# Patient Record
Sex: Male | Born: 2000 | Race: Black or African American | Hispanic: No | Marital: Single | Smoking: Never smoker
Health system: Southern US, Community
[De-identification: ages and names within clinical notes are randomized; demographics above are authoritative.]

## PROBLEM LIST (undated history)

## (undated) DIAGNOSIS — T7840XA Allergy, unspecified, initial encounter: Secondary | ICD-10-CM

## (undated) HISTORY — DX: Allergy, unspecified, initial encounter: T78.40XA

---
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
## 2021-01-01 DIAGNOSIS — Z1152 Encounter for screening for COVID-19: Secondary | ICD-10-CM | POA: Diagnosis not present

## 2021-04-24 ENCOUNTER — Encounter: Payer: Self-pay | Admitting: Family Medicine

## 2021-04-24 ENCOUNTER — Other Ambulatory Visit: Payer: Self-pay

## 2021-04-24 ENCOUNTER — Ambulatory Visit (INDEPENDENT_AMBULATORY_CARE_PROVIDER_SITE_OTHER): Payer: 59 | Admitting: Family Medicine

## 2021-04-24 VITALS — BP 118/64 | HR 56 | Temp 97.3°F | Resp 18 | Ht 70.5 in | Wt 159.0 lb

## 2021-04-24 DIAGNOSIS — Z23 Encounter for immunization: Secondary | ICD-10-CM

## 2021-04-24 DIAGNOSIS — G1220 Motor neuron disease, unspecified: Secondary | ICD-10-CM

## 2021-04-24 DIAGNOSIS — R531 Weakness: Secondary | ICD-10-CM | POA: Diagnosis not present

## 2021-04-24 DIAGNOSIS — Z0000 Encounter for general adult medical examination without abnormal findings: Secondary | ICD-10-CM

## 2021-04-24 NOTE — Progress Notes (Signed)
Today I helps her headache Ajovy organ review although would you ask Angie does see if she could waive a Mr. Setser's chart so I can close it thank you for  Established Patient Office Visit  Subjective:  Patient ID: Zachary Mcneil, male    DOB: 2000/08/19  Age: 20 y.o. MRN: [id number]  CC:  Chief Complaint  Patient presents with   Establish Care    Pt c/o Weakness right  side, both arm and leg tingling, pt can't run, pt denies injury to right side. Pts mother states he has lost strength in both are and leg, major loss of mobility visibly slower    HPI [name] presents for evaluation of a 2 to 74-month history of right-sided weakness/slowness as compared to his left side.  He is right-hand dominant.  He has noticed it when he runs or plays the drums in his church.  Is hard for him to keep time.  He is accompanied by his mother who confirms.  Denies any trauma to his head denies headaches, neck pain, mid or low back pain.  Denies changes in his vision hearing taste swallowing.  Denies paresthesias or dysesthesias on either side of his body.  Denies diplopia.  He has a stable at [employer] within undeclared major.  Things are going well.  Past Medical History:  Diagnosis Date   Allergy     History reviewed. No pertinent surgical history.  Family History  Problem Relation Age of Onset   40 / Stillbirths Mother    Heart disease Mother    Anxiety disorder Mother    Asthma Brother    Hypertension Maternal Grandmother    Heart disease Maternal Grandmother    Hypertension Maternal Grandfather    Kidney disease Paternal Grandmother    Hypertension Paternal Grandmother    Diabetes Paternal Grandmother    Hypertension Paternal Grandfather     Social History   Socioeconomic History   Marital status: Single    Spouse name: Not on file   Number of children: Not on file   Years of education: Not on file   Highest education level: Not on file  Occupational History   Not on  file  Tobacco Use   Smoking status: Never   Smokeless tobacco: Never  Substance and Sexual Activity   Alcohol use: Never   Drug use: Never   Sexual activity: Never    Birth control/protection: Abstinence  Other Topics Concern   Not on file  Social History Narrative   Not on file   Social Determinants of Health   Financial Resource Strain: Not on file  Food Insecurity: Not on file  Transportation Needs: Not on file  Physical Activity: Not on file  Stress: Not on file  Social Connections: Not on file  Intimate Partner Violence: Not on file    No outpatient medications prior to visit.   No facility-administered medications prior to visit.    No Known Allergies  ROS Review of Systems  Constitutional:  Negative for chills, diaphoresis, fatigue, fever and unexpected weight change.  HENT: Negative.    Eyes:  Negative for photophobia and visual disturbance.  Respiratory:  Negative for shortness of breath and wheezing.   Cardiovascular:  Negative for palpitations.  Gastrointestinal:  Negative for nausea and vomiting.  Endocrine: Negative for polyphagia and polyuria.  Genitourinary: Negative.   Musculoskeletal:  Negative for back pain, neck pain and neck stiffness.  Skin:  Negative for pallor and rash.  Neurological:  Positive for weakness. Negative for dizziness, facial asymmetry, light-headedness, numbness and headaches.  Psychiatric/Behavioral: Negative.       Objective:    Physical Exam Vitals and nursing note reviewed.  Constitutional:      General: He is not in acute distress.    Appearance: Normal appearance. He is normal weight. He is not ill-appearing, toxic-appearing or diaphoretic.  HENT:     Head: Normocephalic and atraumatic.     Right Ear: Tympanic membrane, ear canal and external ear normal.     Left Ear: Tympanic membrane, ear canal and external ear normal.     Mouth/Throat:     Mouth: Mucous membranes are moist.     Pharynx: Oropharynx is clear. No  oropharyngeal exudate or posterior oropharyngeal erythema.  Eyes:     General: No visual field deficit or scleral icterus.       Right eye: No discharge.        Left eye: No discharge.     Extraocular Movements: Extraocular movements intact.     Conjunctiva/sclera: Conjunctivae normal.     Pupils: Pupils are equal, round, and reactive to light.  Cardiovascular:     Rate and Rhythm: Normal rate and regular rhythm.  Pulmonary:     Effort: Pulmonary effort is normal.     Breath sounds: Normal breath sounds.  Abdominal:     General: Bowel sounds are normal.  Musculoskeletal:     Right shoulder: Normal.     Left shoulder: Normal.     Cervical back: Normal. No rigidity or tenderness.     Thoracic back: Normal.     Lumbar back: Normal.  Lymphadenopathy:     Cervical: No cervical adenopathy.  Neurological:     General: No focal deficit present.     Mental Status: He is alert and oriented to person, place, and time.     Cranial Nerves: No cranial nerve deficit, dysarthria or facial asymmetry.     Sensory: Sensation is intact.     Motor: No weakness, tremor, atrophy or pronator drift.     Coordination: Romberg sign negative. Finger-Nose-Finger Test normal. Impaired rapid alternating movements.     Gait: Gait is intact. Gait and tandem walk normal.     Comments: Finger-nose testing was a little slower on the right side.  Psychiatric:        Mood and Affect: Mood normal.        Behavior: Behavior normal.    BP 118/64 (BP Location: Left Arm, Patient Position: Sitting, Cuff Size: Normal)   Pulse (!) 56   Temp (!) 97.3 F (36.3 C) (Temporal)   Resp 18   Ht 5' 10.5" (1.791 m)   Wt 159 lb (72.1 kg)   SpO2 99%   BMI 22.49 kg/m  Wt Readings from Last 3 Encounters:  04/24/21 159 lb (72.1 kg) (59 %, Z= 0.22)*   * Growth percentiles are based on CDC (Boys, 2-20 Years) data.     Health Maintenance Due  Topic Date Due   COVID-19 Vaccine (1) Never done   HPV VACCINES (1 - Male 2-dose  series) Never done   HIV Screening  Never done   Hepatitis C Screening  Never done   TETANUS/TDAP  Never done   INFLUENZA VACCINE  03/02/2021       Topic Date Due   HPV VACCINES (1 - Male 2-dose series) Never done    No results found for: TSH No results found for: WBC, HGB, HCT, MCV, PLT No  results found for: NA, K, CHLORIDE, CO2, GLUCOSE, BUN, CREATININE, BILITOT, ALKPHOS, AST, ALT, PROT, ALBUMIN, CALCIUM, ANIONGAP, EGFR, GFR No results found for: CHOL No results found for: HDL No results found for: LDLCALC No results found for: TRIG No results found for: CHOLHDL No results found for: HGBA1C    Assessment & Plan:   Problem List Items Addressed This Visit   None Visit Diagnoses     Flu vaccine need    -  Primary   Relevant Orders   Flu Vaccine QUAD 6+ mos PF IM (Fluarix Quad PF)   Healthcare maintenance       Motor neuron disease [facility])       Relevant Orders   MR Brain W Wo Contrast   Ambulatory referral to Neurology   Right sided weakness       Relevant Orders   C-reactive protein   Sedimentation rate   CBC w/Diff   Comprehensive metabolic panel   MR Brain W Wo Contrast   Ambulatory referral to Neurology       No orders of the defined types were placed in this encounter.   Follow-up: Return in about 3 months (around 07/24/2021), or if symptoms worsen or fail to improve.    Libby Maw, MD

## 2021-04-25 LAB — CBC WITH DIFFERENTIAL/PLATELET
Absolute Monocytes: 562 cells/uL (ref 200–950)
Basophils Absolute: 52 cells/uL (ref 0–200)
Basophils Relative: 1 %
Eosinophils Absolute: 458 cells/uL (ref 15–500)
Eosinophils Relative: 8.8 %
HCT: 44.7 % (ref 38.5–50.0)
Hemoglobin: 15 g/dL (ref 13.2–17.1)
Lymphs Abs: 2350 cells/uL (ref 850–3900)
MCH: 30.4 pg (ref 27.0–33.0)
MCHC: 33.6 g/dL (ref 32.0–36.0)
MCV: 90.5 fL (ref 80.0–100.0)
MPV: 9.8 fL (ref 7.5–12.5)
Monocytes Relative: 10.8 %
Neutro Abs: 1778 cells/uL (ref 1500–7800)
Neutrophils Relative %: 34.2 %
Platelets: 375 10*3/uL (ref 140–400)
RBC: 4.94 10*6/uL (ref 4.20–5.80)
RDW: 12.4 % (ref 11.0–15.0)
Total Lymphocyte: 45.2 %
WBC: 5.2 10*3/uL (ref 3.8–10.8)

## 2021-04-25 LAB — COMPREHENSIVE METABOLIC PANEL
AG Ratio: 1.8 (calc) (ref 1.0–2.5)
ALT: 20 U/L (ref 8–46)
AST: 64 U/L — ABNORMAL HIGH (ref 12–32)
Albumin: 4.4 g/dL (ref 3.6–5.1)
Alkaline phosphatase (APISO): 81 U/L (ref 46–169)
BUN: 9 mg/dL (ref 7–20)
CO2: 27 mmol/L (ref 20–32)
Calcium: 9.8 mg/dL (ref 8.9–10.4)
Chloride: 102 mmol/L (ref 98–110)
Creat: 0.93 mg/dL (ref 0.60–1.24)
Globulin: 2.5 g/dL (calc) (ref 2.1–3.5)
Glucose, Bld: 81 mg/dL (ref 65–99)
Potassium: 5.1 mmol/L (ref 3.8–5.1)
Sodium: 138 mmol/L (ref 135–146)
Total Bilirubin: 0.5 mg/dL (ref 0.2–1.1)
Total Protein: 6.9 g/dL (ref 6.3–8.2)

## 2021-04-25 LAB — SEDIMENTATION RATE: Sed Rate: 2 mm/h (ref 0–15)

## 2021-04-25 LAB — C-REACTIVE PROTEIN: CRP: 0.9 mg/L (ref <8.0)

## 2021-04-27 ENCOUNTER — Encounter: Payer: Self-pay | Admitting: Neurology

## 2021-04-28 ENCOUNTER — Encounter: Payer: Self-pay | Admitting: Family Medicine

## 2021-05-09 ENCOUNTER — Ambulatory Visit (HOSPITAL_BASED_OUTPATIENT_CLINIC_OR_DEPARTMENT_OTHER)
Admission: RE | Admit: 2021-05-09 | Discharge: 2021-05-09 | Disposition: A | Discharge status: Home / Self Care | Payer: 59 | Source: Ambulatory Visit | Attending: Family Medicine | Admitting: Family Medicine

## 2021-05-09 ENCOUNTER — Other Ambulatory Visit: Payer: Self-pay

## 2021-05-09 DIAGNOSIS — G1220 Motor neuron disease, unspecified: Secondary | ICD-10-CM | POA: Diagnosis not present

## 2021-05-09 DIAGNOSIS — R531 Weakness: Secondary | ICD-10-CM | POA: Insufficient documentation

## 2021-05-09 DIAGNOSIS — G9389 Other specified disorders of brain: Secondary | ICD-10-CM | POA: Diagnosis not present

## 2021-05-09 DIAGNOSIS — R29898 Other symptoms and signs involving the musculoskeletal system: Secondary | ICD-10-CM | POA: Diagnosis not present

## 2021-05-09 IMAGING — MR MR HEAD WO/W CM
12 series · 48 of 48 positions shown · IV contrast (gadavist)
Comparison: No pertinent prior exams available for comparison.

CLINICAL DATA: Motor neuron disease. Right-sided weakness.
Additional history provided by scanning technologist: Patient
reports diffuse right-sided weakness and tingling with activity for
3 months.

EXAM:
MRI HEAD WITHOUT AND WITH CONTRAST
TECHNIQUE: Multiplanar, multiecho pulse sequences of the brain and surrounding
structures were obtained without and with intravenous contrast.
CONTRAST:  7mL GADAVIST GADOBUTROL 1 MMOL/ML IV SOLN

[Series 3: T1 · sagittal · 5.0mm · 0.47mm/px · 1 of 23 slices shown]
[im 1/23]
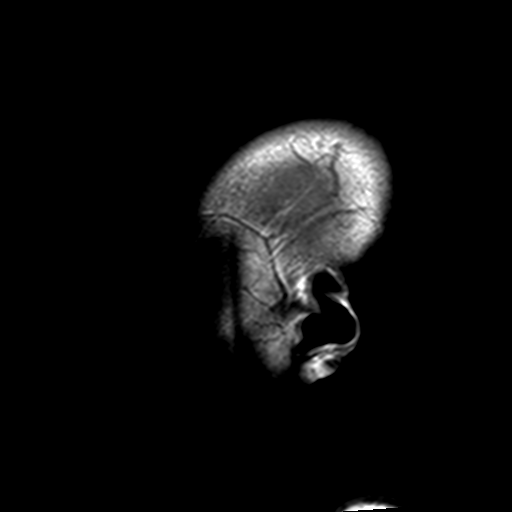

[Series 4: DWI · axial · 3.0mm · 1.95mm/px · z∈[-23,+139]mm · 8 of 109 slices shown (1 of 4)]
[im 1/109]
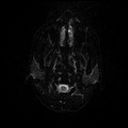
[im 16/109]
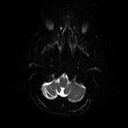
[im 31/109]
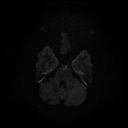
[im 47/109]
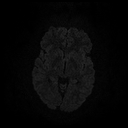
[im 62/109]
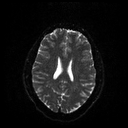
[im 78/109]
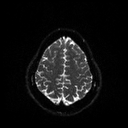
[im 93/109]
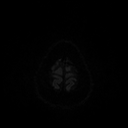
[im 109/109]
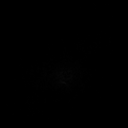

[Series 5: DWI · axial · 3.0mm · 1.95mm/px · z∈[-23,+139]mm · 4 of 54 slices shown (2 of 4)]
[im 1/54]
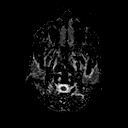
[im 18/54]
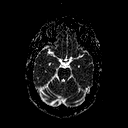
[im 36/54]
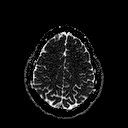
[im 54/54]
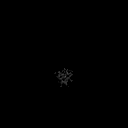

[Series 6: T2 · axial · 5.0mm · 0.45mm/px · z∈[-37,+124]mm · 2 of 28 slices shown (1 of 2)]
[im 1/28]
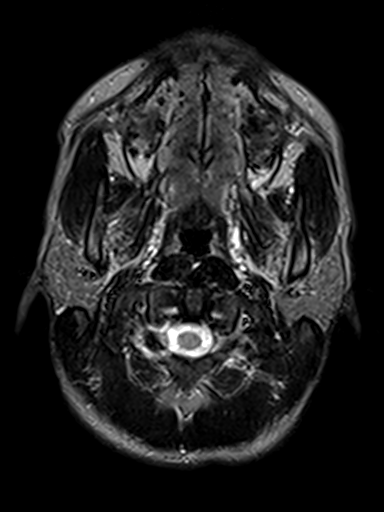
[im 28/28]
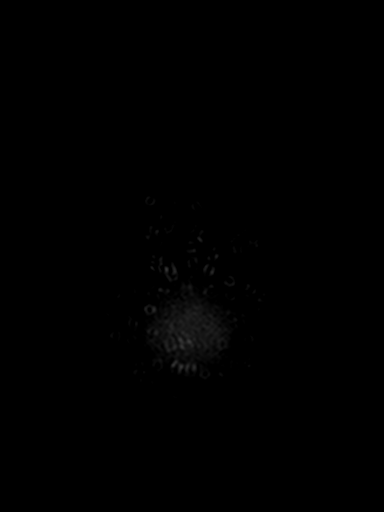

[Series 7: t2_tirm_tra_dark-fluid · axial · 3.0mm · 0.45mm/px · z∈[-38,+125]mm · 3 of 42 slices shown]
[im 1/42]
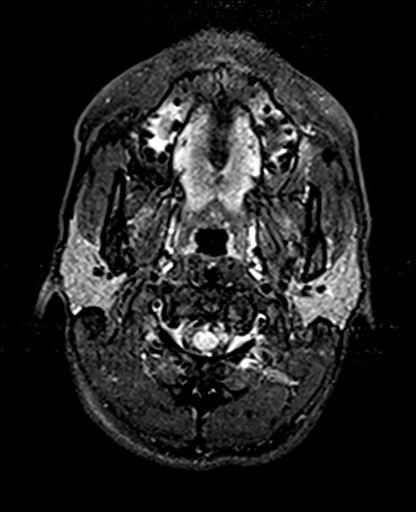
[im 21/42]
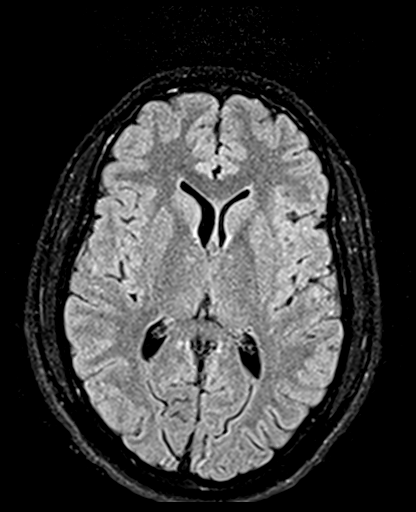
[im 42/42]
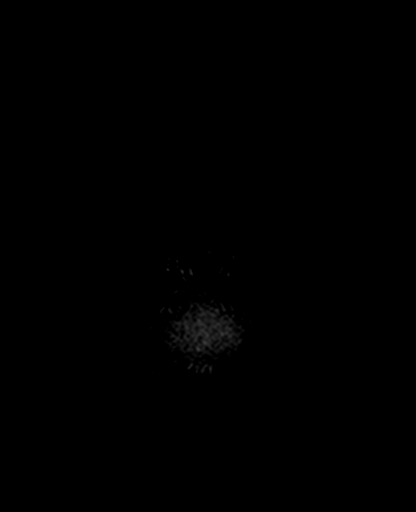

[Series 8: T2 · axial · 5.0mm · 0.45mm/px · z∈[-37,+124]mm · 2 of 28 slices shown (2 of 2)]
[im 1/28]
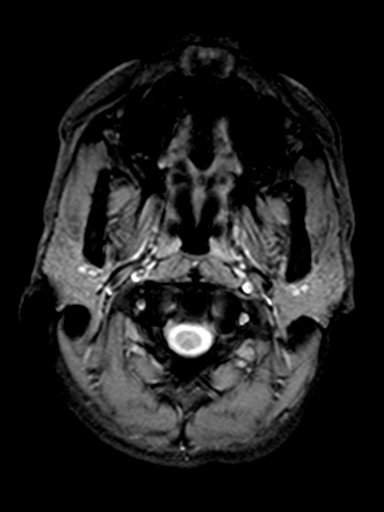
[im 28/28]
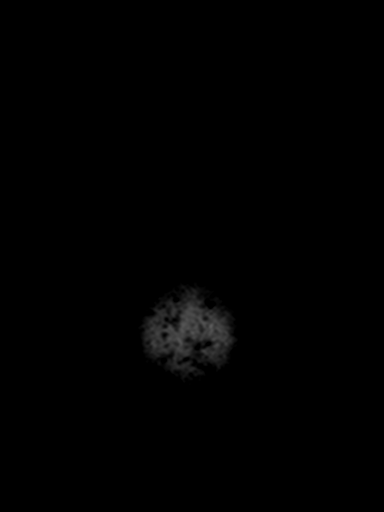

[Series 9: t1_3d_tra · axial · 2.0mm · 0.94mm/px · z∈[-60,+128]mm · 7 of 96 slices shown]
[im 1/96]
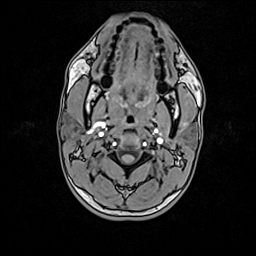
[im 16/96]
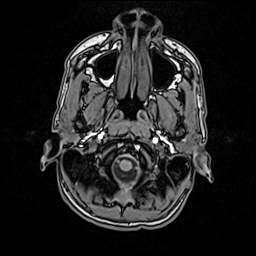
[im 32/96]
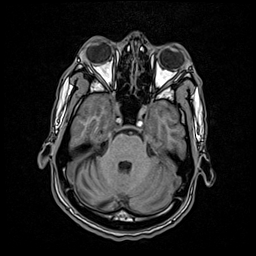
[im 48/96]
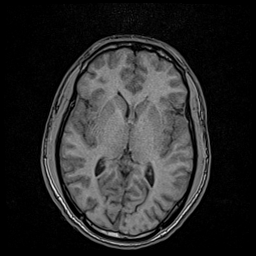
[im 64/96]
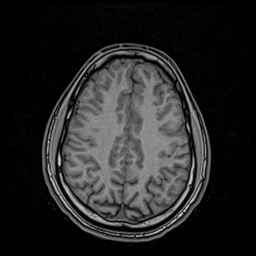
[im 80/96]
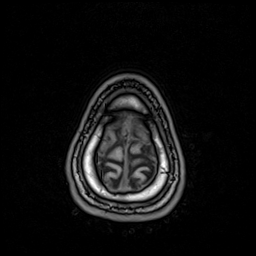
[im 96/96]
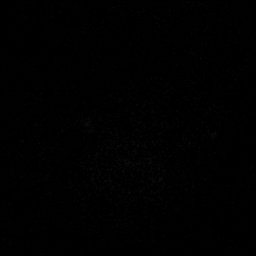

[Series 10: DWI · coronal · 3.0mm · 1.46mm/px · 7 of 100 slices shown (3 of 4)]
[im 1/100]
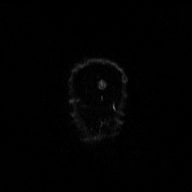
[im 17/100]
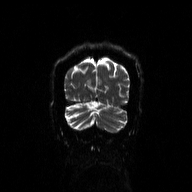
[im 34/100]
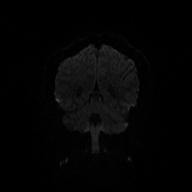
[im 50/100]
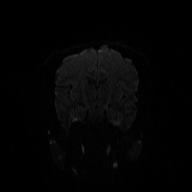
[im 67/100]
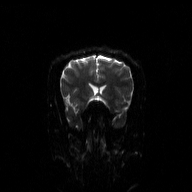
[im 83/100]
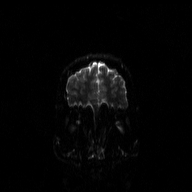
[im 100/100]
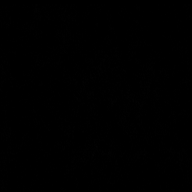

[Series 11: DWI · coronal · 3.0mm · 1.46mm/px · 3 of 50 slices shown (4 of 4)]
[im 1/50]
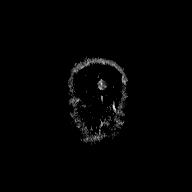
[im 25/50]
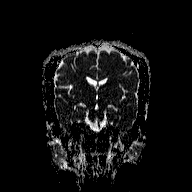
[im 50/50]
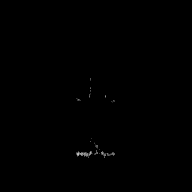

[Series 12: T2 post-contrast · coronal · 5.0mm · 0.45mm/px · 2 of 30 slices shown]
[im 1/30]
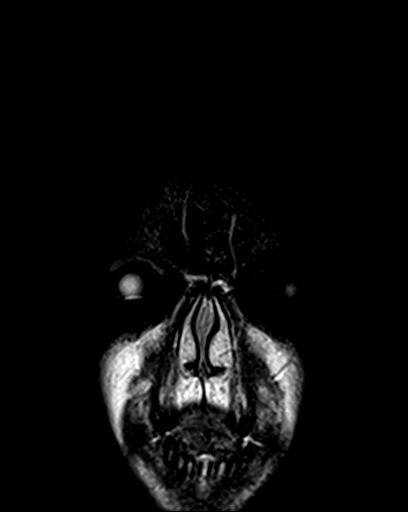
[im 30/30]
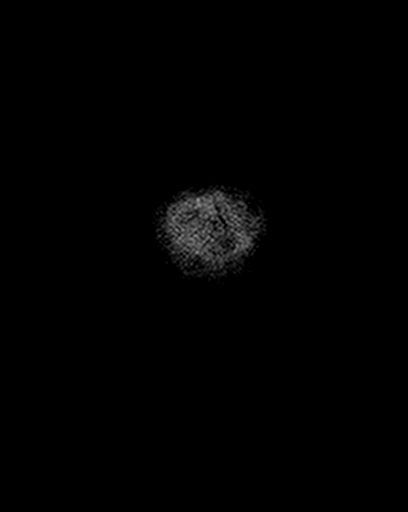

[Series 13: T1 post-contrast · coronal · 5.0mm · 0.45mm/px · 2 of 30 slices shown]
[im 1/30]
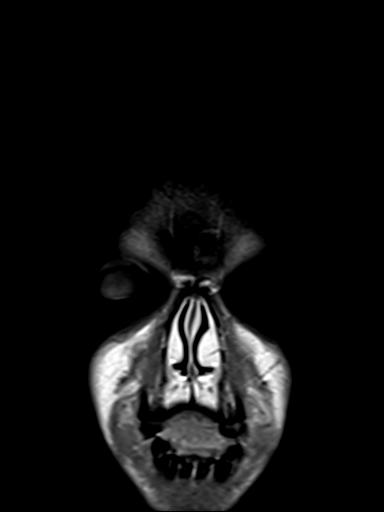
[im 30/30]
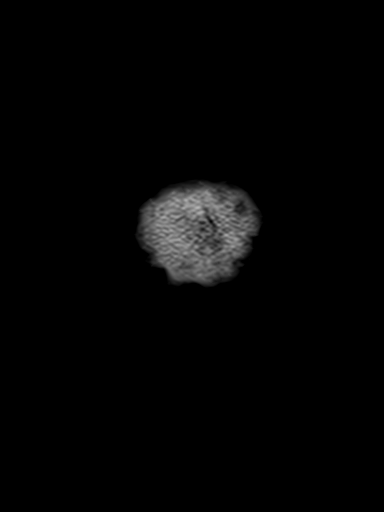

[Series 14: post t1_3d_tra · axial · 2.0mm · 0.94mm/px · z∈[-60,+128]mm · 7 of 96 slices shown]
[im 1/96]
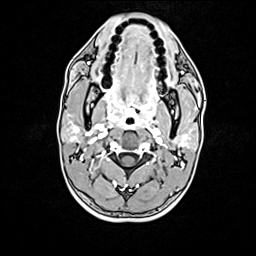
[im 16/96]
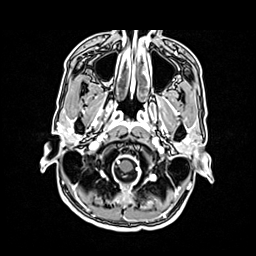
[im 32/96]
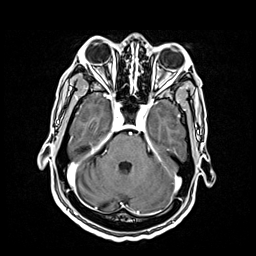
[im 48/96]
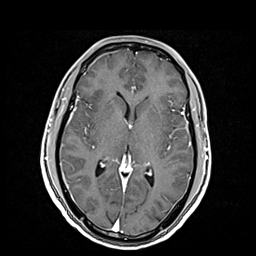
[im 64/96]
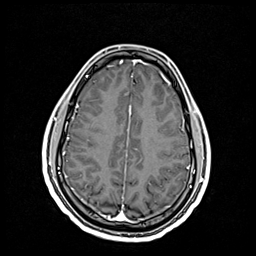
[im 80/96]
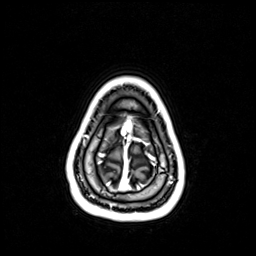
[im 96/96]
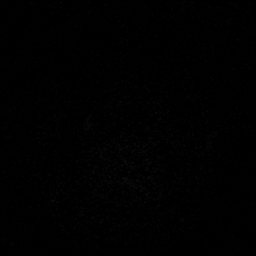

[48 of 48 positions shown; findings below may reference images not displayed]

FINDINGS: Brain:

Cerebral volume is normal.

No cortical encephalomalacia is identified. No significant cerebral
white matter disease.

There is no acute infarct.

No evidence of an intracranial mass.

No chronic intracranial blood products.

No extra-axial fluid collection.

No midline shift.

No pathologic intracranial enhancement identified.

Vascular: Maintained flow voids within the proximal large arterial
vessels.

Skull and upper cervical spine: Focal suspicious marrow lesion.

Sinuses/Orbits: Visualized orbits show no acute finding. Trace
mucosal thickening within the bilateral ethmoid and maxillary
sinuses.
IMPRESSION: Unremarkable MRI appearance of the brain. No evidence of acute
intracranial abnormality.

Minimal paranasal sinus mucosal thickening, as described.

## 2021-05-09 MED ORDER — GADOBUTROL 1 MMOL/ML IV SOLN
7.0000 mL | Freq: Once | INTRAVENOUS | Status: AC | PRN
  Administered 2021-05-09: 7 mL via INTRAVENOUS

## 2021-05-22 ENCOUNTER — Ambulatory Visit: Payer: 59 | Admitting: Family Medicine

## 2021-05-22 ENCOUNTER — Encounter: Payer: Self-pay | Admitting: Family Medicine

## 2021-05-22 ENCOUNTER — Other Ambulatory Visit: Payer: Self-pay

## 2021-05-22 ENCOUNTER — Other Ambulatory Visit (HOSPITAL_COMMUNITY): Payer: Self-pay

## 2021-05-22 VITALS — BP 115/68 | HR 79 | Temp 98.2°F | Ht 70.0 in | Wt 154.4 lb

## 2021-05-22 DIAGNOSIS — R531 Weakness: Secondary | ICD-10-CM

## 2021-05-22 DIAGNOSIS — R7989 Other specified abnormal findings of blood chemistry: Secondary | ICD-10-CM | POA: Diagnosis not present

## 2021-05-22 DIAGNOSIS — G1220 Motor neuron disease, unspecified: Secondary | ICD-10-CM | POA: Diagnosis not present

## 2021-05-22 DIAGNOSIS — R0982 Postnasal drip: Secondary | ICD-10-CM | POA: Diagnosis not present

## 2021-05-22 MED ORDER — FLUTICASONE PROPIONATE 50 MCG/ACT NA SUSP
2.0000 | Freq: Every day | NASAL | 3 refills | Status: DC
  Filled 2021-05-22: qty 16, 30d supply, fill #0

## 2021-05-22 NOTE — Progress Notes (Addendum)
Established Patient Office Visit  Subjective:  Patient ID: Zachary Mcneil, male    DOB: 09-12-2000  Age: 20 y.o. MRN: [id number]  CC:  Chief Complaint  Patient presents with   Follow-up    Follow up on MRI, concerns about diagnosis after MIR.     HPI Zachary Mcneil presents for follow-up of right-sided weakness.  Symptoms persist but have not progressed.  Been no changes.  Denies pain or numbness and tingling.  Denies headaches.  No neck or lower back pain.  Has been having some seasonal sneezing and postnasal drip.  Denies increased Tylenol use or alcohol.  Admits that there are some days when he does not feel like doing anything but denies any sense of sadness depression otherwise.  School can be stressful and is sometimes anxiety provoking that resolved.  Past Medical History:  Diagnosis Date   Allergy     History reviewed. No pertinent surgical history.  Family History  Problem Relation Age of Onset   Miscarriages / Stillbirths Mother    Heart disease Mother    Anxiety disorder Mother    Asthma Brother    Hypertension Maternal Grandmother    Heart disease Maternal Grandmother    Hypertension Maternal Grandfather    Kidney disease Paternal Grandmother    Hypertension Paternal Grandmother    Diabetes Paternal Grandmother    Hypertension Paternal Grandfather     Social History   Socioeconomic History   Marital status: Single    Spouse name: Not on file   Number of children: Not on file   Years of education: Not on file   Highest education level: Not on file  Occupational History   Not on file  Tobacco Use   Smoking status: Never   Smokeless tobacco: Never  Substance and Sexual Activity   Alcohol use: Never   Drug use: Never   Sexual activity: Never    Birth control/protection: Abstinence  Other Topics Concern   Not on file  Social History Narrative   Not on file   Social Determinants of Health   Financial Resource Strain: Not on file  Food Insecurity:  Not on file  Transportation Needs: Not on file  Physical Activity: Not on file  Stress: Not on file  Social Connections: Not on file  Intimate Partner Violence: Not on file    No outpatient medications prior to visit.   No facility-administered medications prior to visit.    No Known Allergies  ROS Review of Systems  Constitutional:  Negative for diaphoresis, fatigue, fever and unexpected weight change.  HENT:  Positive for congestion, postnasal drip and sneezing. Negative for sinus pressure and sinus pain.   Eyes:  Negative for photophobia and visual disturbance.  Cardiovascular: Negative.   Gastrointestinal: Negative.   Musculoskeletal:  Negative for back pain, neck pain and neck stiffness.  Neurological:  Positive for weakness. Negative for numbness and headaches.  Psychiatric/Behavioral:  Negative for dysphoric mood. The patient is not nervous/anxious.   Depression screen [facility] 2/9 05/22/2021  Decreased Interest 0  Down, Depressed, Hopeless 0  PHQ - 2 Score 0       Objective:    Physical Exam  BP 115/68 (BP Location: Right Arm, Patient Position: Sitting, Cuff Size: Normal)   Pulse 79   Temp 98.2 F (36.8 C) (Temporal)   Ht 5\' 10"  (1.778 m)   Wt 154 lb 6.4 oz (70 kg)   SpO2 98%   BMI 22.15 kg/m  Wt Readings from  Last 3 Encounters:  05/22/21 154 lb 6.4 oz (70 kg) (51 %, Z= 0.03)*  04/24/21 159 lb (72.1 kg) (59 %, Z= 0.22)*   * Growth percentiles are based on CDC (Boys, 2-20 Years) data.     Health Maintenance Due  Topic Date Due   HPV VACCINES (1 - Male 2-dose series) Never done   HIV Screening  Never done   Hepatitis C Screening  Never done   TETANUS/TDAP  Never done       Topic Date Due   HPV VACCINES (1 - Male 2-dose series) Never done    No results found for: TSH Lab Results  Component Value Date   WBC 5.2 04/24/2021   HGB 15.0 04/24/2021   HCT 44.7 04/24/2021   MCV 90.5 04/24/2021   PLT 375 04/24/2021   Lab Results  Component Value  Date   NA 138 04/24/2021   K 5.1 04/24/2021   CO2 27 04/24/2021   GLUCOSE 81 04/24/2021   BUN 9 04/24/2021   CREATININE 0.93 04/24/2021   BILITOT 0.5 04/24/2021   AST 64 (H) 04/24/2021   ALT 20 04/24/2021   PROT 6.9 04/24/2021   CALCIUM 9.8 04/24/2021   No results found for: CHOL No results found for: HDL No results found for: LDLCALC No results found for: TRIG No results found for: CHOLHDL No results found for: XNTZ0Y    Assessment & Plan:   Problem List Items Addressed This Visit   None Visit Diagnoses     Motor neuron disease (HCC)    -  Primary   Relevant Orders   Ambulatory referral to Neurology   Right sided weakness       Relevant Orders   Ambulatory referral to Neurology   Post-nasal drip       Relevant Medications   fluticasone (FLONASE) 50 MCG/ACT nasal spray   Elevated LFTs       Relevant Orders   Hepatic function panel   Hepatitis C antibody       Meds ordered this encounter  Medications   fluticasone (FLONASE) 50 MCG/ACT nasal spray    Sig: Place 2 sprays into both nostrils daily.    Dispense:  16 g    Refill:  3     Follow-up: Return in about 3 months (around 08/22/2021).    Mliss Sax, MD

## 2021-05-25 ENCOUNTER — Encounter: Payer: Self-pay | Admitting: Neurology

## 2021-05-25 LAB — HEPATITIS C ANTIBODY
Hepatitis C Ab: NONREACTIVE
SIGNAL TO CUT-OFF: 0.02 (ref <1.00)

## 2021-05-25 LAB — HEPATIC FUNCTION PANEL
AG Ratio: 1.5 (calc) (ref 1.0–2.5)
ALT: 13 U/L (ref 8–46)
AST: 16 U/L (ref 12–32)
Albumin: 4.3 g/dL (ref 3.6–5.1)
Alkaline phosphatase (APISO): 87 U/L (ref 46–169)
Bilirubin, Direct: 0.1 mg/dL (ref 0.0–0.2)
Globulin: 2.8 g/dL (calc) (ref 2.1–3.5)
Indirect Bilirubin: 0.3 mg/dL (calc) (ref 0.2–1.1)
Total Bilirubin: 0.4 mg/dL (ref 0.2–1.1)
Total Protein: 7.1 g/dL (ref 6.3–8.2)

## 2021-07-06 ENCOUNTER — Ambulatory Visit: Payer: 59 | Admitting: Neurology

## 2021-07-23 ENCOUNTER — Ambulatory Visit: Payer: 59 | Admitting: Family Medicine

## 2021-08-17 ENCOUNTER — Ambulatory Visit: Payer: 59 | Admitting: Neurology

## 2021-08-17 ENCOUNTER — Other Ambulatory Visit: Payer: Self-pay

## 2021-08-17 ENCOUNTER — Encounter: Payer: Self-pay | Admitting: Neurology

## 2021-08-17 ENCOUNTER — Other Ambulatory Visit (INDEPENDENT_AMBULATORY_CARE_PROVIDER_SITE_OTHER): Payer: 59

## 2021-08-17 VITALS — BP 112/69 | HR 58 | Ht 70.0 in | Wt 163.0 lb

## 2021-08-17 DIAGNOSIS — R531 Weakness: Secondary | ICD-10-CM

## 2021-08-17 DIAGNOSIS — R259 Unspecified abnormal involuntary movements: Secondary | ICD-10-CM | POA: Diagnosis not present

## 2021-08-17 LAB — TSH: TSH: 2.69 u[IU]/mL (ref 0.40–5.00)

## 2021-08-17 LAB — B12 AND FOLATE PANEL
Folate: 7.7 ng/mL (ref >5.9)
Vitamin B-12: 665 pg/mL (ref 211–911)

## 2021-08-17 NOTE — Patient Instructions (Signed)
Check labs  Nerve testing of the right arm and leg  ELECTROMYOGRAM AND NERVE CONDUCTION STUDIES (EMG/NCS) INSTRUCTIONS  How to Prepare The neurologist conducting the EMG will need to know if you have certain medical conditions. Tell the neurologist and other EMG lab personnel if you: Have a pacemaker or any other electrical medical device Take blood-thinning medications Have hemophilia, a blood-clotting disorder that causes prolonged bleeding Bathing Take a shower or bath shortly before your exam in order to remove oils from your skin. Don't apply lotions or creams before the exam.  What to Expect You'll likely be asked to change into a hospital gown for the procedure and lie down on an examination table. The following explanations can help you understand what will happen during the exam.  Electrodes. The neurologist or a technician places surface electrodes at various locations on your skin depending on where you're experiencing symptoms. Or the neurologist may insert needle electrodes at different sites depending on your symptoms.  Sensations. The electrodes will at times transmit a tiny electrical current that you may feel as a twinge or spasm. The needle electrode may cause discomfort or pain that usually ends shortly after the needle is removed. If you are concerned about discomfort or pain, you may want to talk to the neurologist about taking a short break during the exam.  Instructions. During the needle EMG, the neurologist will assess whether there is any spontaneous electrical activity when the muscle is at rest - activity that isn't present in healthy muscle tissue - and the degree of activity when you slightly contract the muscle.  He or she will give you instructions on resting and contracting a muscle at appropriate times. Depending on what muscles and nerves the neurologist is examining, he or she may ask you to change positions during the exam.  After your EMG You may experience  some temporary, minor bruising where the needle electrode was inserted into your muscle. This bruising should fade within several days. If it persists, contact your primary care doctor.   

## 2021-08-17 NOTE — Progress Notes (Signed)
[facility] Neurology Division Clinic Note - Initial Visit   Date: 08/17/21  [name] MRN: [id number] DOB: [birth year]   Dear Dr. Doreene Burke:  Thank you for your kind referral of Zachary Mcneil for consultation of right sided weakness. Although his history is well known to you, please allow Zachary Mcneil to reiterate it for the purpose of our medical record. The patient was accompanied to the clinic by mother who also provides collateral information.     History of Present Illness: Zachary Mcneil is a 21 y.o. right-handed male with no prior medical history presenting for evaluation of right sided weakness. Starting around September 2022, he began noticing that he was unable to write as fast and movement on the right side where slower, such as finger tapping. He feels that climbing stairs is harder with the right leg. He is unable to run and finds himself to jog more. He also complains of tremors in the right hand and episodic jerking of his torso. Symptoms occur daily.  Usually worse with stress.  He denies stiffness, cramps, or pain.  He denies falls or hospitalizations. No recent illnesses. MRI brain from 05/2021 is normal.  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   Out-side paper records, electronic medical record, and images have been reviewed where available and summarized as:  MRI brain wwo contrast 05/09/2021:  Unremarkable MRI appearance of the brain. No evidence of acute intracranial abnormality.   Minimal paranasal sinus mucosal thickening, as described.    Lab Results  Component Value Date   ESRSEDRATE 2 04/24/2021    Past Medical History:  Diagnosis Date   Allergy     History reviewed. No pertinent surgical history.   Medications:  Outpatient Encounter Medications as of 08/17/2021  Medication Sig   fluticasone (FLONASE) 50 MCG/ACT nasal spray Place 2 sprays into both nostrils daily.   No facility-administered encounter  medications on file as of 08/17/2021.    Allergies: No Known Allergies  Family History: Family History  Problem Relation Age of Onset   Miscarriages / Stillbirths Mother    Heart disease Mother    Anxiety disorder Mother    Asthma Brother    Hypertension Maternal Grandmother    Heart disease Maternal Grandmother    Hypertension Maternal Grandfather    Kidney disease Paternal Grandmother    Hypertension Paternal Grandmother    Diabetes Paternal Grandmother    Hypertension Paternal Grandfather     Social History: Social History   Tobacco Use   Smoking status: Never   Smokeless tobacco: Never  Substance Use Topics   Alcohol use: Never   Drug use: Never   Social History   Social History Narrative   Right Handed    Lives in a two story home    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  Speech is not dysarthric.  CRANIAL NERVES: II:  No visual field defects.    III-IV-VI: Pupils equal round and reactive to light.  Normal conjugate, extra-ocular eye movements in all directions of gaze.  No nystagmus.  No ptosis.  V:  Normal facial sensation.    VII:  Normal facial symmetry and movements.   VIII:  Normal hearing and vestibular function.   IX-X:  Normal palatal movement.   XI:  Normal shoulder shrug and head rotation.   XII:  Normal tongue strength and range of motion, no deviation or fasciculation.  MOTOR:  No atrophy, fasciculations or abnormal movements.  No pronator drift.   Upper Extremity:  Right  Left  Deltoid  5/5   5/5   Biceps  5/5   5/5   Triceps  5/5   5/5   Infraspinatus 5/5  5/5  Medial pectoralis 5/5  5/5  Wrist extensors  5/5   5/5   Wrist flexors  5/5    5/5   Finger extensors  5/5   5/5   Finger flexors  5/5   5/5   Dorsal interossei  5/5   5/5   Abductor pollicis  5/5   5/5   Tone (Ashworth scale)  0  0   Lower Extremity:  Right  Left  Hip flexors  5/5   5/5   Hip extensors  5/5   5/5   Adductor 5/5  5/5  Abductor 5/5  5/5  Knee flexors  5/5   5/5   Knee extensors  5/5   5/5   Dorsiflexors  5/5   5/5   Plantarflexors  5/5   5/5   Toe extensors  5/5   5/5   Toe flexors  5/5   5/5   Tone (Ashworth scale)  0  0   MSRs:  Right        Left                  brachioradialis 2+  2+  biceps 2+  2+  triceps 2+  2+  patellar 2+  2+  ankle jerk 2+  2+  Hoffman no  no  plantar response down  down   SENSORY:  Normal and symmetric perception of light touch, pinprick, vibration, and proprioception.  Romberg's sign absent.   COORDINATION/GAIT: Normal finger-to- nose-finger. Mild bradykinesia with finger and toe tapping on the right.  Intact rapid alternating movements bilaterally.  Gait narrow based and stable. Tandem and stressed gait intact.    IMPRESSION: Right sided weakness (subjective), no evidence of weakness on my exam.  There is no evidence of motor neuron disorder.  Mild bradykinesia on the right side.  No rigidity, tremor (on my exam), or gait instability.  No evidence of dystonia on exam.  MRI brain reviewed and is normal. No sign of MS. Exam is normal, except mild right bradykinesia.    PLAN/RECOMMENDATIONS:  Check TSH, vitamin B12, folate, copper NCS/EMG of the right arm and leg Consider seeking opinion of movement disorder specialist going forward   Thank you for allowing me to participate in patient's care.  If I can answer any additional questions, I would be pleased to do so.    Sincerely,    Norvella Loscalzo K. Allena Katz, DO

## 2021-08-19 LAB — COPPER, SERUM: Copper: 100 ug/dL (ref 70–175)

## 2021-08-21 ENCOUNTER — Other Ambulatory Visit (HOSPITAL_COMMUNITY): Payer: Self-pay

## 2021-08-21 MED ORDER — CARESTART COVID-19 HOME TEST VI KIT
PACK | 0 refills | Status: DC
  Filled 2021-08-21: qty 4, 4d supply, fill #0

## 2021-09-15 ENCOUNTER — Ambulatory Visit (INDEPENDENT_AMBULATORY_CARE_PROVIDER_SITE_OTHER): Payer: 59 | Admitting: Neurology

## 2021-09-15 ENCOUNTER — Other Ambulatory Visit: Payer: Self-pay

## 2021-09-15 DIAGNOSIS — R259 Unspecified abnormal involuntary movements: Secondary | ICD-10-CM

## 2021-09-15 DIAGNOSIS — R531 Weakness: Secondary | ICD-10-CM | POA: Diagnosis not present

## 2021-09-18 NOTE — Procedures (Signed)
[facility] Neurology  [address] [place], [address]  [place], [place] [address] Tel: [phone] Fax:  [phone] Test Date:  09/15/2021  Patient: Zachary Mcneil DOB: 12-05-2000 Physician: Nita Sickle, DO  Sex: Male Height: 5\' 10"  Ref Phys: , DO  ID#: Nita Sickle   Technician:    Patient Complaints: This is a 21 year old man referred for evaluation of right-sided weakness and loss of coordination.    NCV & EMG Findings: Extensive electrodiagnostic testing of the right upper and lower extremities shows: All sensory responses including the right median, ulnar, mixed palmar, sural, and superficial peroneal nerves are within normal limits All motor responses including the right median, ulnar, peroneal, and tibial nerves are within normal limits. Right tibial H reflex study is within normal limits. There is no evidence of active or chronic motor axonal loss changes affecting any of the tested muscles.  Motor unit configuration and recruitment pattern is within normal limits.  Impression: This is a normal study of the right upper and lower extremities.  In particular, there is no evidence of a myopathy, cervical/lumbosacral radiculopathy, or large fiber neuropathy.    ___________________________ 12, DO    Nerve Conduction Studies Anti Sensory Summary Table   Stim Site NR Peak (ms) Norm Peak (ms) P-T Amp (V) Norm P-T Amp  Right Median Anti Sensory (2nd Digit)  33C  Wrist    2.6 <3.3 95.8 >20  Right Sup Peroneal Anti Sensory (Ant Lat Mall)  33C  12 cm    2.7 <4.4 32.1 >6  Right Sural Anti Sensory (Lat Mall)  33C  Calf    3.7 <4.4 33.1 >6  Right Ulnar Anti Sensory (5th Digit)  33C  Wrist    2.5 <3.0 89.5 >18   Motor Summary Table   Stim Site NR Onset (ms) Norm Onset (ms) O-P Amp (mV) Norm O-P Amp Site1 Site2 Delta-0 (ms) Dist (cm) Vel (m/s) Norm Vel (m/s)  Right Median Motor (Abd Poll Brev)  33C  Wrist    2.9 <3.9 11.2 >6 Elbow Wrist 4.4 31.0 70 >51   Elbow    7.3  10.0         Right Peroneal Motor (Ext Dig Brev)  33C  Ankle    3.4 <5.5 10.9 >3 B Fib Ankle 7.7 45.0 58 >41  B Fib    11.1  9.6  Poplt B Fib 1.6 9.0 56 >41  Poplt    12.7  9.6         Right Tibial Motor (Abd Hall Brev)  33C  Ankle    3.3 <5.8 15.1 >8 Knee Ankle 9.8 47.0 48 >41  Knee    13.1  10.8         Right Ulnar Motor (Abd Dig Minimi)  33C  Wrist    2.2 <3.0 15.3 >8 B Elbow Wrist 3.9 24.0 62 >51  B Elbow    6.1  15.0  A Elbow B Elbow 1.6 10.0 62 >51  A Elbow    7.7  14.4          H Reflex Studies   NR H-Lat (ms) Lat Norm (ms) L-R H-Lat (ms)  Right Tibial (Gastroc)  33C     29.12 <35    EMG   Side Muscle Ins Act Fibs Psw Fasc Number Recrt Dur Dur. Amp Amp. Poly Poly. Comment  Right AntTibialis Nml Nml Nml Nml Nml Nml Nml Nml Nml Nml Nml Nml N/A  Right Gastroc Nml Nml Nml Nml  Nml Nml Nml Nml Nml Nml Nml Nml N/A  Right Flex Dig Long Nml Nml Nml Nml Nml Nml Nml Nml Nml Nml Nml Nml N/A  Right RectFemoris Nml Nml Nml Nml Nml Nml Nml Nml Nml Nml Nml Nml N/A  Right BicepsFemS Nml Nml Nml Nml Nml Nml Nml Nml Nml Nml Nml Nml N/A  Right 1stDorInt Nml Nml Nml Nml Nml Nml Nml Nml Nml Nml Nml Nml N/A  Right PronatorTeres Nml Nml Nml Nml Nml Nml Nml Nml Nml Nml Nml Nml N/A  Right Biceps Nml Nml Nml Nml Nml Nml Nml Nml Nml Nml Nml Nml N/A  Right Triceps Nml Nml Nml Nml Nml Nml Nml Nml Nml Nml Nml Nml N/A  Right Deltoid Nml Nml Nml Nml Nml Nml Nml Nml Nml Nml Nml Nml N/A      Waveforms:

## 2021-09-22 ENCOUNTER — Encounter: Payer: Self-pay | Admitting: Neurology

## 2021-09-22 ENCOUNTER — Other Ambulatory Visit: Payer: Self-pay

## 2021-09-22 DIAGNOSIS — R259 Unspecified abnormal involuntary movements: Secondary | ICD-10-CM

## 2021-09-30 NOTE — Progress Notes (Signed)
? ? ?Assessment/Plan:  ? ? ?1.  Right-sided weakness ? -Although this is the complaint, weakness was not really noted on his examination today. ? -MRI brain is negative. ? -I did not notice a significant amount of bradykinesia on examination today.  His toe taps were slightly slow, but otherwise did not notice any significant bradykinesia.  While differential diagnosis of juvenile bradykinesia would be wide (juvenile PD, Wilson's, juvenile HD, DRD), I am not really convinced he has any of these.  Discussed with patient and mother that if the above is negative, we may just want to hold on further testing and see what other objective measures develop with time, if anything.  Certainly, if they would like to proceed with other testing, DaTscan could be done, but I think the yield would be very low.  Similarly, I think the yield would be low with 24-hour urine copper, but we will go ahead and do the ceruloplasmin today. ? -Dr. Posey Pronto has already done a serum copper and LFTs that were normal 4 months ago.  5 months ago, his AST was elevated. ? -MRI cervical spine will be done given perception of R sided weakness and neck discomfort. ? ?Subjective:  ? ?Zachary Mcneil was seen today in the movement disorders clinic for neurologic consultation at the request of Alda Berthold, DO.  The consultation is for the evaluation of bradykinesia.  Medical records made available to me are reviewed (personally discussed case with Dr. Posey Pronto as well).  Patient is with his mother who supplements the history.  Patient saw Dr. Posey Pronto first August 17, 2021 for the evaluation of right-sided weakness.  Patient noted that in the summer, 2022, he was having more difficulty writing with the right hand and handwriting was "more messy."  The right side of the body seemed slower, and the right leg seem to drag when he was climbing the stairs (he would trip up the stairs).   On examination, Dr. Posey Pronto did not notice weakness of the right side of the  body, but did notice potential bradykinesia.  Specifically, she also did not notice dystonia.  MRI brain with and without gadolinium was completed on May 09, 2021.  I personally reviewed this.  It was normal.  He had EMG testing February 14 with Dr. Posey Pronto that was likewise unremarkable. ? ?Patient reports that he developed normally as a child.  He had no difficulty with meeting developmental milestones.  He had no difficulty with physical education or sports as a child.  There is no family history of issues similar to what he is having.  He currently plays bball and notes more trouble with the shooting - "I can't shoot as far."  He shoots with the right hand, but otherwise can shoot okay.  He plays video games and the R hand is not as fast.  No cramping.  No neck pain (?discomfort).  He and his mother describe some "jerking" of the trunk that is intermittent and separate from the tremor. ? ? ?Specific Symptoms:  ?Tremor: Yes.  , a small shake intermittently with use (mom noted it when pouring cereal). ?Family hx of similar:  husbands brother with epilepsy but no fam hx of movement d/o ?Voice: no change ?Sleep: sleeps well ? Vivid Dreams:  No. ? Acting out dreams:  No. ?Wet Pillows: No. ?Postural symptoms:  with hopping or climbing stairs, he has trouble.  He has trouble running (right leg feels delayed) but is able to jog ? Falls?  [place] one time putting on shoes.  Otherwise, is normal ?Bradykinesia symptoms: no bradykinesia noted ?Loss of smell:  No. ?Loss of taste:  No. ?Urinary Incontinence:  No. ?Difficulty Swallowing:  No. ?Handwriting, micrographia: No. ?Depression:  No. ?N/V:  No. ?Lightheaded:  occ if gets up fast or if looks up for while ? Syncope: No. ?Diplopia:  No. ? ? ?ALLERGIES:  No Known Allergies ? ?CURRENT MEDICATIONS:  ?No outpatient medications have been marked as taking for the 10/05/21 encounter (Office Visit) with Ayushi Pla, Eustace Quail, DO.  ?  ? ?Objective:  ? ?VITALS:   ?Vitals:  ? 10/05/21 0928   ?BP: 122/83  ?Pulse: 76  ?SpO2: 99%  ?Weight: 159 lb 14.4 oz (72.5 kg)  ?Height: 5\' 10"  (1.778 m)  ? ? ?GEN:  The patient appears stated age and is in NAD. ?HEENT:  Normocephalic, atraumatic.  The mucous membranes are moist. The superficial temporal arteries are without ropiness or tenderness. ?CV:  RRR ?Lungs:  CTAB ?Neck/HEME:  There are no carotid bruits bilaterally. ? ?Neurological examination: ? ?Orientation: The patient is alert and oriented x3.  ?Cranial nerves: There is good facial symmetry. Extraocular muscles are intact. The visual fields are full to confrontational testing. The speech is fluent and clear. Soft palate rises symmetrically and there is no tongue deviation. Hearing is intact to conversational tone. ?Sensation: Sensation is intact to light and pinprick throughout (facial, trunk, extremities). Vibration is intact at the bilateral big toe. There is no extinction with double simultaneous stimulation. There is no sensory dermatomal level identified. ?Motor: Strength is 5/5 in the bilateral upper and lower extremities.   Shoulder shrug is equal and symmetric.  There is no pronator drift. ?Deep tendon reflexes: Deep tendon reflexes are 2/4 at the bilateral biceps, triceps, brachioradialis, patella and achilles. Plantar responses are downgoing bilaterally. ? ?Movement examination: ?Tone: There is nl tone in the bilateral upper extremities.  The tone in the lower extremities is nl.  ?Abnormal movements: none even with distraction procedures ?Coordination:  There is no decremation with RAM's, with any form of RAMS, including alternating supination and pronation of the forearm, hand opening and closing, finger taps, heel taps and toe taps.  Mild slowness with toe taps on the right ?Gait and Station: The patient has no difficulty arising out of a deep-seated chair without the use of the hands. The patient's stride length is good with good arm swing.  He is able to jog down the hall without any trouble,  with good arm swing which is symmetric.  He has a negative pull test.   ?I have reviewed and interpreted the following labs independently ?  Chemistry   ?   ?Component Value Date/Time  ? NA 138 04/24/2021 1556  ? K 5.1 04/24/2021 1556  ? CL 102 04/24/2021 1556  ? CO2 27 04/24/2021 1556  ? BUN 9 04/24/2021 1556  ? CREATININE 0.93 04/24/2021 1556  ?    ?Component Value Date/Time  ? CALCIUM 9.8 04/24/2021 1556  ? AST 16 05/22/2021 1653  ? ALT 13 05/22/2021 1653  ? BILITOT 0.4 05/22/2021 1653  ?  ? ? ?Lab Results  ?Component Value Date  ? TSH 2.69 08/17/2021  ? ?Lab Results  ?Component Value Date  ? WBC 5.2 04/24/2021  ? HGB 15.0 04/24/2021  ? HCT 44.7 04/24/2021  ? MCV 90.5 04/24/2021  ? PLT 375 04/24/2021  ? ? ? ?Total time spent on today's visit was 55 minutes, including both face-to-face time and nonface-to-face time.  Time included that spent on review of records (prior notes available to me/labs/imaging if pertinent), discussing treatment and goals, answering patient's questions and coordinating care. ? ?Cc:  Libby Maw, MD ? ?

## 2021-10-05 ENCOUNTER — Other Ambulatory Visit (INDEPENDENT_AMBULATORY_CARE_PROVIDER_SITE_OTHER): Payer: 59

## 2021-10-05 ENCOUNTER — Ambulatory Visit: Payer: 59 | Admitting: Neurology

## 2021-10-05 ENCOUNTER — Encounter: Payer: Self-pay | Admitting: Neurology

## 2021-10-05 ENCOUNTER — Other Ambulatory Visit: Payer: Self-pay

## 2021-10-05 VITALS — BP 122/83 | HR 76 | Ht 70.0 in | Wt 159.9 lb

## 2021-10-05 DIAGNOSIS — R748 Abnormal levels of other serum enzymes: Secondary | ICD-10-CM | POA: Diagnosis not present

## 2021-10-05 DIAGNOSIS — M542 Cervicalgia: Secondary | ICD-10-CM

## 2021-10-05 DIAGNOSIS — R531 Weakness: Secondary | ICD-10-CM

## 2021-10-05 NOTE — Patient Instructions (Signed)
Your provider has requested that you have labwork completed today. The lab is located on the Second floor at [address], within the [facility] office. When you get off the elevator, turn right and go in the [facility] [address]; the first brown door on the left.  Tell the ladies behind the desk that you are there for lab work. If you are not called within 15 minutes please check with the front desk.  ? ?Once you complete your labs you are free to go. You will receive a call or message via MyChart with your lab results.    ?A referral to [facility] has been placed for your MRI someone will contact you directly to schedule your appt. They are located at [address] [facility]. Please contact them directly by calling [phone] with any questions regarding your referral. labs ?

## 2021-10-06 LAB — CERULOPLASMIN: Ceruloplasmin: 28 mg/dL (ref 18–36)

## 2021-10-08 ENCOUNTER — Encounter: Payer: Self-pay | Admitting: Neurology

## 2021-10-23 ENCOUNTER — Other Ambulatory Visit: Payer: Self-pay

## 2021-10-23 ENCOUNTER — Ambulatory Visit
Admission: RE | Admit: 2021-10-23 | Discharge: 2021-10-23 | Disposition: A | Discharge status: Home / Self Care | Payer: 59 | Source: Ambulatory Visit | Attending: Neurology | Admitting: Neurology

## 2021-10-23 DIAGNOSIS — M5023 Other cervical disc displacement, cervicothoracic region: Secondary | ICD-10-CM | POA: Diagnosis not present

## 2021-10-23 IMAGING — MR MR CERVICAL SPINE W/O CM
5 series · 34 of 48 positions shown · non-contrast
Comparison: None available.

CLINICAL DATA: Initial evaluation for neck pain, right arm and hand
weakness for 6 months.

EXAM:
MRI CERVICAL SPINE WITHOUT CONTRAST
TECHNIQUE: Multiplanar, multisequence MR imaging of the cervical spine was
performed. No intravenous contrast was administered.

[Series 2: T2 · sagittal · 3.0mm · 0.41mm/px · 6 of 15 slices shown (1 of 2)]
[im 1/15]
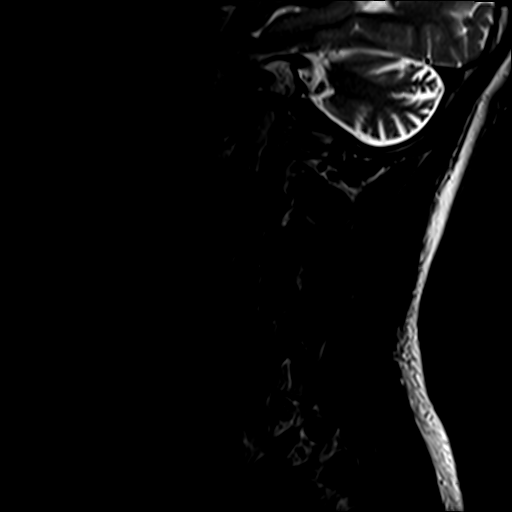
[im 3/15]
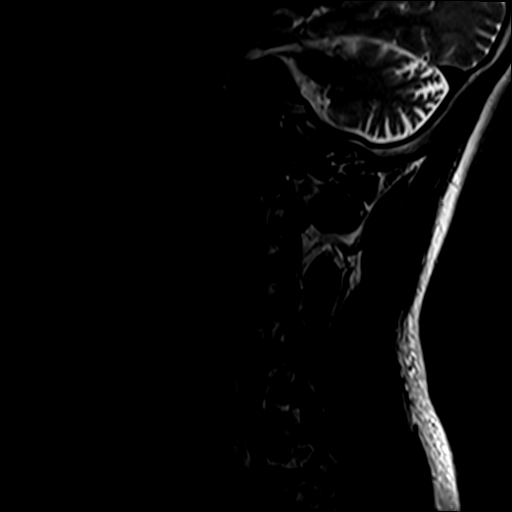
[im 6/15]
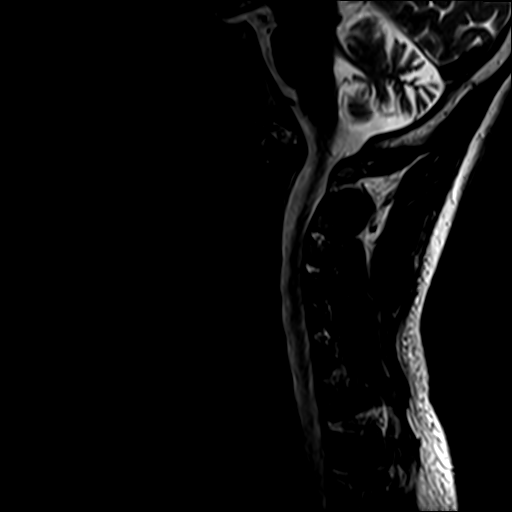
[im 9/15]
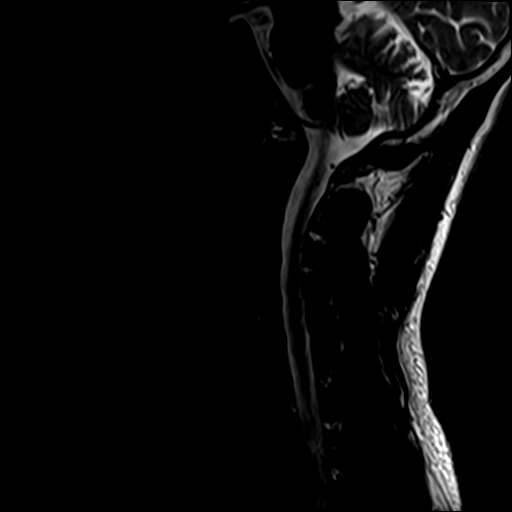
[im 12/15]
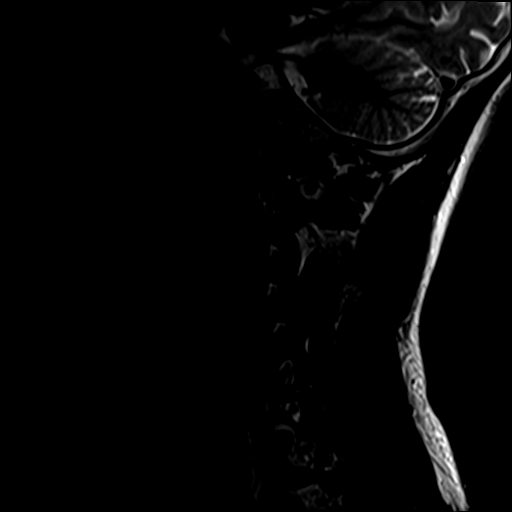
[im 15/15]
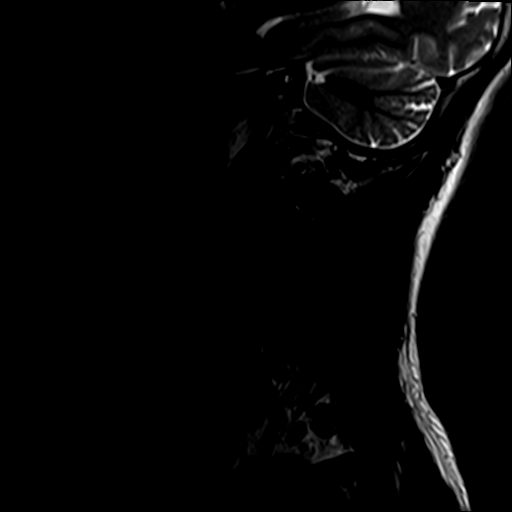

[Series 3: STIR · sagittal · 3.0mm · 0.82mm/px · 7 of 15 slices shown]
[im 1/15]
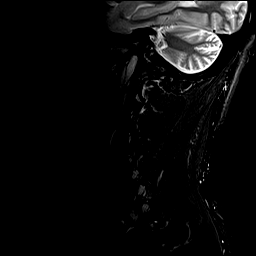
[im 3/15]
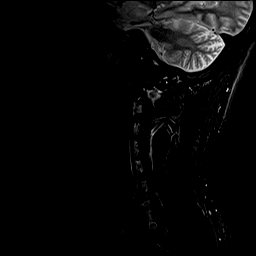
[im 5/15]
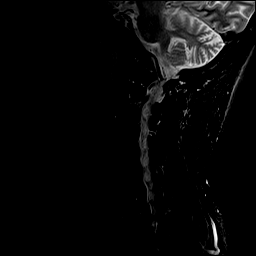
[im 8/15]
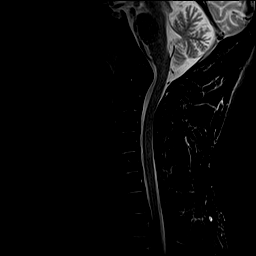
[im 10/15]
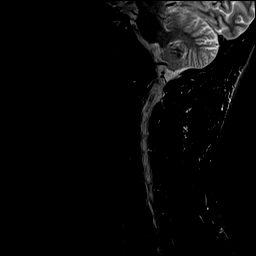
[im 12/15]
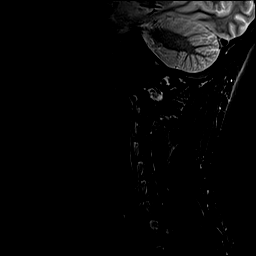
[im 15/15]
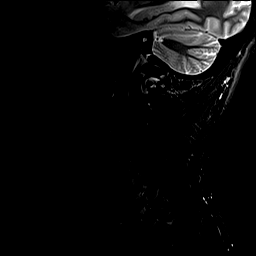

[Series 4: T1 · sagittal · 3.0mm · 0.82mm/px · 7 of 15 slices shown]
[im 1/15]
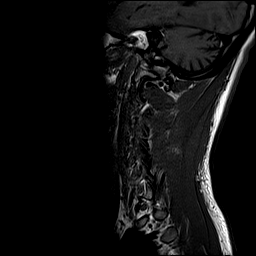
[im 3/15]
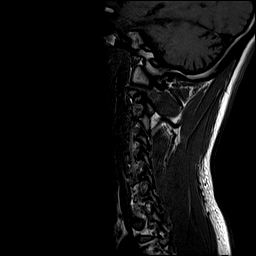
[im 5/15]
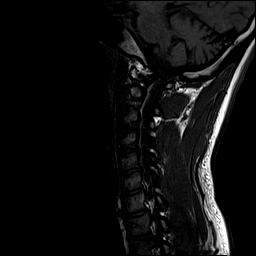
[im 8/15]
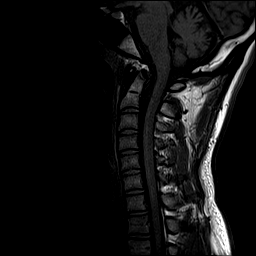
[im 10/15]
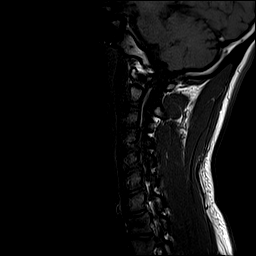
[im 12/15]
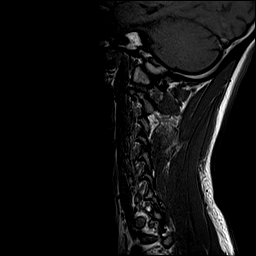
[im 15/15]
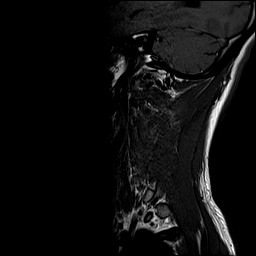

[Series 5: T2 · axial · 3.0mm · 0.70mm/px · z∈[-75,+27]mm · 8 of 29 slices shown (2 of 2)]
[im 1/29]
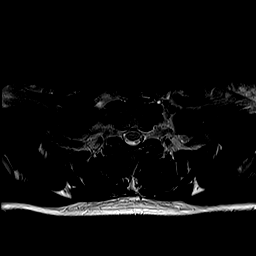
[im 5/29]
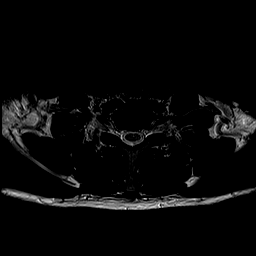
[im 9/29]
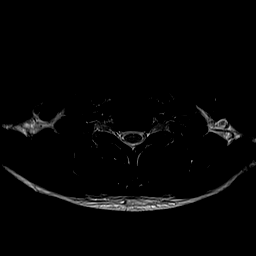
[im 13/29]
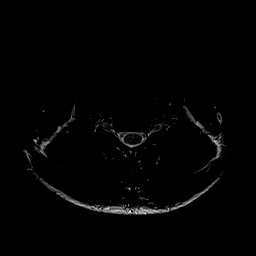
[im 16/29]
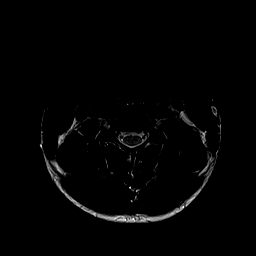
[im 20/29]
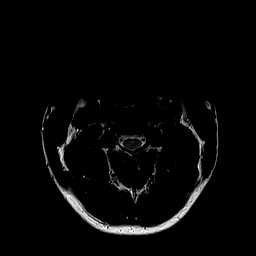
[im 24/29]
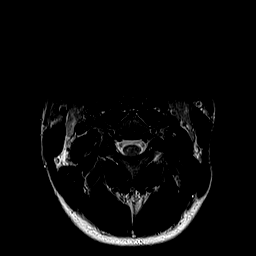
[im 29/29]
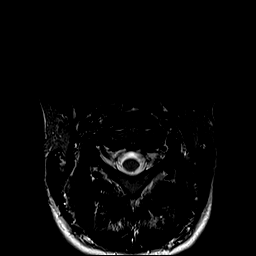

[Series 6: GRE · axial · 3.0mm · 0.35mm/px · z∈[-75,-6]mm · 6 of 29 slices shown]
[im 1/29]
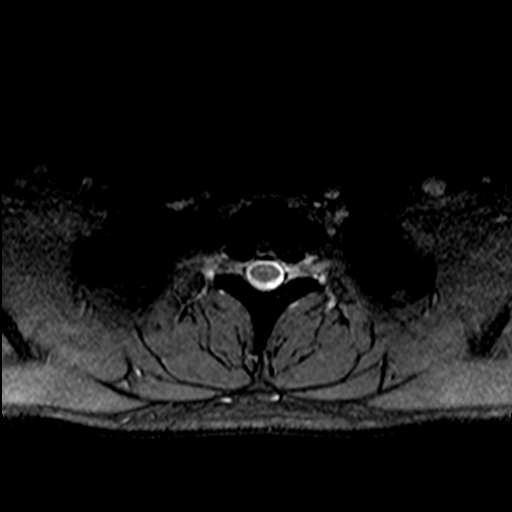
[im 5/29]
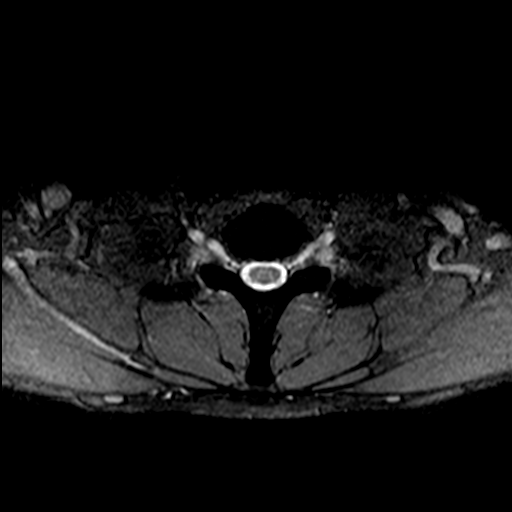
[im 9/29]
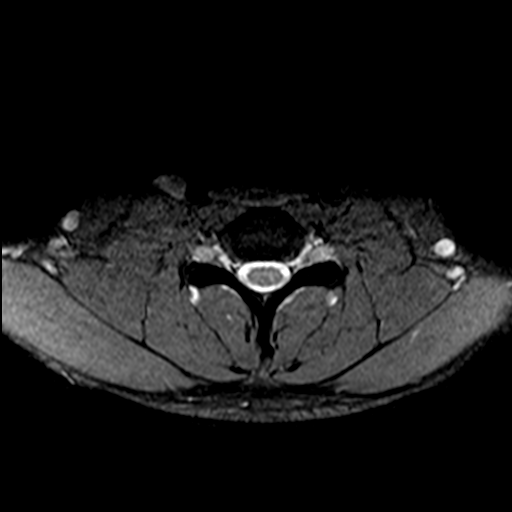
[im 13/29]
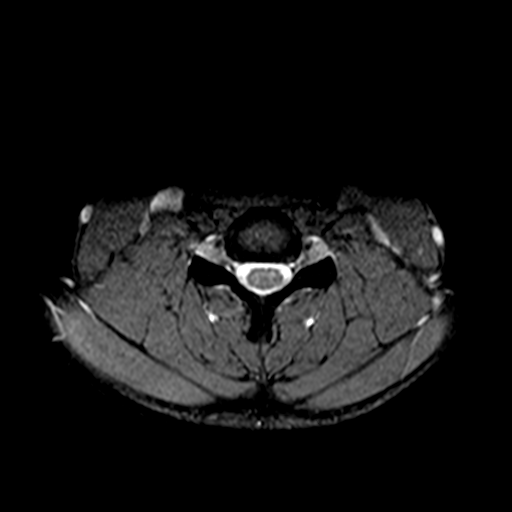
[im 16/29]
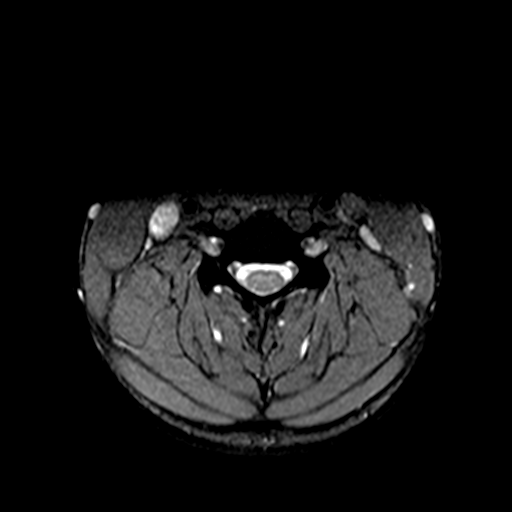
[im 20/29]
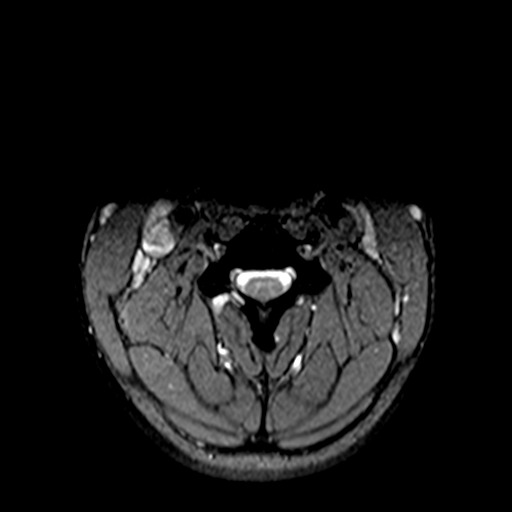

[34 of 48 positions shown; findings below may reference images not displayed]

FINDINGS: Alignment: Straightening of the normal cervical lordosis. No
listhesis.

Vertebrae: Vertebral body height maintained without acute or chronic
fracture. Bone marrow signal intensity within normal limits. No
discrete or worrisome osseous lesions or abnormal marrow edema.

Cord: Normal signal and morphology.

Posterior Fossa, vertebral arteries, paraspinal tissues:
Unremarkable.

Disc levels:

C2-C3: Unremarkable.

C3-C4:  Unremarkable.

C4-C5:  Unremarkable.

C5-C6:  Unremarkable.

C6-C7:  Unremarkable.

C7-T1: Small central disc protrusion mildly indents the ventral
thecal sac (series 6, image 26). Minimal flattening of the ventral
cord without cord signal changes or significant spinal stenosis.
Foramina remain patent.

Visualized upper thoracic spine demonstrates no significant finding.
IMPRESSION: 1. Small central disc protrusion at C7-T1 with secondary minimal
flattening of the ventral spinal cord, but no cord signal changes or
significant stenosis.
2. Otherwise unremarkable and normal MRI of the cervical spine and
spinal cord.

## 2021-10-27 ENCOUNTER — Encounter: Payer: Self-pay | Admitting: Neurology

## 2021-10-27 NOTE — Telephone Encounter (Signed)
Called patient and went over results patient understands and has no questions at this time  ?

## 2023-12-14 ENCOUNTER — Encounter: Payer: Self-pay | Admitting: Family Medicine

## 2024-01-09 ENCOUNTER — Telehealth: Payer: Self-pay | Admitting: Neurology

## 2024-01-09 NOTE — Telephone Encounter (Signed)
 Pt called and informed that muscle and nerve testing is normal.

## 2024-01-09 NOTE — Telephone Encounter (Signed)
 Patient wants to get the results of the EMG for 09-15-21 he states that he never got them

## 2024-04-06 ENCOUNTER — Ambulatory Visit: Payer: Self-pay

## 2024-04-06 NOTE — Telephone Encounter (Signed)
 FYI Only or Action Required?: FYI only for provider.  Patient was last seen in primary care on 05/22/2021 by Berneta Elsie Sayre, MD.  Called Nurse Triage reporting Neurologic Problem.  Symptoms began today.  Interventions attempted: Nothing.  Symptoms are: gradually improving.  Triage Disposition: Go to ED Now (Notify PCP)  Patient/caregiver understands and will follow disposition?: Yes        Copied from CRM [id number]. Topic: Clinical - Red Word Triage >> Apr 06, 2024  4:22 PM Dedra B wrote: Red Word that prompted transfer to Nurse Triage: Pt is experiencing headache, numbness in R hand and lip, and weakness in R side of body. Warm transfer to nurse triage.       Reason for Disposition  [1] Numbness (i.e., loss of sensation) of the face, arm / hand, or leg / foot on one side of the body AND [2] sudden onset AND [3] brief (now gone)  Answer Assessment - Initial Assessment Questions 1. SYMPTOM: What is the main symptom you are concerned about? (e.g., weakness, numbness)     Lip and ringer numbness on the right side of body   2. ONSET: When did this start? (e.g., minutes, hours, days; while sleeping)     Today  3. LAST NORMAL: When was the last time you (the patient) were normal (no symptoms)?     About an hour ago  4. PATTERN Does this come and go, or has it been constant since it started?  Is it present now?     Constant  5. CARDIAC SYMPTOMS: Have you had any of the following symptoms: chest pain, difficulty breathing, palpitations?     No 6. NEUROLOGIC SYMPTOMS: Have you had any of the following symptoms: headache, dizziness, vision loss, double vision, changes in speech, unsteady on your feet?     Headache  7. OTHER SYMPTOMS: Do you have any other symptoms?     Right sided weakness which is not new  Protocols used: Neurologic Deficit-A-AH

## 2024-04-09 ENCOUNTER — Ambulatory Visit: Payer: Self-pay | Admitting: Family Medicine

## 2024-04-09 VITALS — BP 120/72 | HR 59 | Temp 97.1°F | Ht 70.0 in | Wt 176.2 lb

## 2024-04-09 DIAGNOSIS — R251 Tremor, unspecified: Secondary | ICD-10-CM

## 2024-04-09 DIAGNOSIS — Z23 Encounter for immunization: Secondary | ICD-10-CM | POA: Diagnosis not present

## 2024-04-09 NOTE — Telephone Encounter (Signed)
 Noted; appointment schedule for today with Dr. Berneta MD.

## 2024-04-09 NOTE — Progress Notes (Signed)
 Established Patient Office Visit   Subjective:  Patient ID: Zachary Mcneil, male    DOB: 2001-06-02  Age: 23 y.o. MRN: [id number]  Chief Complaint  Patient presents with   Numbness    Right side numbness and weakness. Pt was last seen 3 years ago and was sent to neurology. But next followed back up with Neurology or Dr. Berneta. Pt is fasting.    Headache    Headaches x 3-4 days. Right side.     Headache  Pertinent negatives include no abdominal pain, blurred vision, eye redness, tingling or weakness.   Encounter Diagnoses  Name Primary?   Immunization due Yes   Tremor of right hand    For follow-up of above.  Lost to follow-up for almost 3 years.  The right side of his body seems to be less coordinated than the left.  He is right-hand dominant.  He notices some difficulty dribbling a basketball as an example.  There is also some difficulty with walking.  He has experienced tremors.  No family history of Parkinson's disease.  Recently graduated from college at and is planning on pursuing a career in business.  He is currently looking for jobs.   Review of Systems  Constitutional: Negative.   HENT: Negative.    Eyes:  Negative for blurred vision, discharge and redness.  Respiratory: Negative.    Cardiovascular: Negative.   Gastrointestinal:  Negative for abdominal pain.  Genitourinary: Negative.   Musculoskeletal: Negative.  Negative for myalgias.  Skin:  Negative for rash.  Neurological:  Positive for tremors and headaches. Negative for tingling, loss of consciousness and weakness.  Endo/Heme/Allergies:  Negative for polydipsia.     Current Outpatient Medications:    COVID-19 At Home Antigen Test [facility] COVID-19 HOME TEST) KIT, Use as directed, Disp: 4 each, Rfl: 0   fluticasone  (FLONASE ) 50 MCG/ACT nasal spray, Place 2 sprays into both nostrils daily., Disp: 16 g, Rfl: 3   Objective:     BP 120/72 (BP Location: Left Arm, Patient Position: Sitting, Cuff Size: Normal)    Pulse (!) 59   Temp (!) 97.1 F (36.2 C) (Temporal)   Ht 5' 10 (1.778 m)   Wt 176 lb 3.2 oz (79.9 kg)   SpO2 99%   BMI 25.28 kg/m    Physical Exam Constitutional:      General: He is not in acute distress.    Appearance: Normal appearance. He is not ill-appearing, toxic-appearing or diaphoretic.  HENT:     Head: Normocephalic and atraumatic.     Right Ear: External ear normal.     Left Ear: External ear normal.     Mouth/Throat:     Mouth: Mucous membranes are moist.     Pharynx: Oropharynx is clear. No oropharyngeal exudate or posterior oropharyngeal erythema.  Eyes:     General: No visual field deficit or scleral icterus.       Right eye: No discharge.        Left eye: No discharge.     Extraocular Movements: Extraocular movements intact.     Conjunctiva/sclera: Conjunctivae normal.     Pupils: Pupils are equal, round, and reactive to light.  Cardiovascular:     Rate and Rhythm: Normal rate and regular rhythm.  Pulmonary:     Effort: Pulmonary effort is normal. No respiratory distress.     Breath sounds: Normal breath sounds. No wheezing or rales.  Abdominal:     General: Bowel sounds are normal.  Musculoskeletal:  Cervical back: No rigidity or tenderness.  Lymphadenopathy:     Cervical: No cervical adenopathy.  Skin:    General: Skin is warm and dry.  Neurological:     Mental Status: He is alert and oriented to person, place, and time.     Cranial Nerves: No cranial nerve deficit, dysarthria or facial asymmetry.     Motor: No weakness.     Coordination: Romberg sign negative. Finger-Nose-Finger Test normal.     Comments: Unilateral tremor noted in the right hand with arms outstretched.  Psychiatric:        Mood and Affect: Mood normal.        Behavior: Behavior normal.      No results found for any visits on 04/09/24.    The ASCVD Risk score (Arnett DK, et al., 2019) failed to calculate for the following reasons:   The 2019 ASCVD risk score is only  valid for ages 25 to 97    Assessment & Plan:   Immunization due -     HPV 9-valent vaccine,Recombinat -     Flu vaccine trivalent PF, 6mos and older(Flulaval,Afluria,Fluarix,Fluzone) -     Tdap vaccine greater than or equal to 7yo IM  Tremor of right hand -     Ambulatory referral to Neurology    Return in about 8 weeks (around 06/04/2024), or Second HPV vaccine, for annual physical.    Elsie Sim Lent, MD

## 2024-04-10 ENCOUNTER — Encounter: Payer: Self-pay | Admitting: Neurology

## 2024-04-16 NOTE — Progress Notes (Unsigned)
 Assessment/Plan:    1.  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  We can certainly test him for DYT1 but the easiest thing might be best to put him on low-dose levodopa  to see if that helps.  Patient and father would like to take this approach, especially since [facility] is no longer within their insurance network.  If he needs a larger workup and low-dose levodopa  does not help, then they would like a referral to [facility], as patient's mother works at [employer].  I think that is reasonable for him.  For now, we decided to start carbidopa /levodopa  25/100, half tablet twice per day and see how he does.  This is very low-dose.  We did discuss r/b/se.  - Patient to email me and let me know how he does.  2.  Migraine (complex)    -one episode that resolved.  Discussed nature and pathophysiology.  Neuroexam nonfocal and nonlateralizing today.  3.  Patient and father asked questions and answered those to the best my ability today.  Subjective:   Zachary Mcneil was seen today in the movement disorders clinic for f/u of Berneta Elsie Sayre,*.  Pt with father who supplements hx.  The patient was last seen by me about 2-1/2 years ago.  At that point in time, there was a description of right sided weakness, but none was really seen.  We talked about a larger workup, but ultimately they decided to hold on that.  We talked about differential including DRD, other dystonia types and the much less likely wilsons, juvenile Parkinsons Disease, etc and ultimately decided to hold off unless things got worse.  He did see his primary care physician recently and it looks like there was similar types of complaints of the right side of the body being less coordinated than the left.  He reports when he plays the drums, he is not coordinated with the R arm, but he also describes  pain in the shoulder with it.  He used to have falls but he doesn't note that now as he is going to the gym and has built up his core strength.  He has previously had trouble hopping, climbing stairs and when he runs, it feels like the R leg lags behind.  He really does not have any trouble with the jogs, but if he attempts to run fast, he may notice that the right leg lags some.  He reports some tremor in the R hand with use - eating, drinking - no spilling of the food.  When lifting weights, the R isn't as strong.  He cannot dribble the basketball as fast on the right despite he is R handed.  No fam hx of movement d/o.  One time 2 weeks ago he had R sided paresthesias of the R lip and R hand  and that went away and then he developed HA for about 1.5 days.  He rates the HA as a 5/10.  He does get HA but not necessarily regularly.    Previous testing for this: MRI brain in 2022: Normal MRI cervical spine in March, 2023: Small disc protrusion at C7-T1, but otherwise unremarkable Random serum copper : Normal B12/folate 665/7.7 EMG: 2023:  RUE/RLE normal  ALLERGIES:  No Known Allergies  CURRENT MEDICATIONS:  Current Meds  Medication Sig   loratadine (CLARITIN) 5 MG  chewable tablet Chew 5 mg by mouth daily.     Objective:   VITALS:   Vitals:   04/18/24 0851  BP: 132/66  Pulse: 71  SpO2: 98%  Weight: 174 lb 9.6 oz (79.2 kg)  Height: 5' 10 (1.778 m)    GEN:  The patient appears stated age and is in NAD. HEENT:  Normocephalic, atraumatic.  The mucous membranes are moist. The superficial temporal arteries are without ropiness or tenderness. CV:  RRR Lungs:  CTAB Neck/HEME:  There are no carotid bruits bilaterally.  Neurological examination:  Orientation: The patient is alert and oriented x3.  Cranial nerves: There is good facial symmetry. Extraocular muscles are intact. The visual fields are full to confrontational testing. The speech is fluent and clear. Soft palate rises symmetrically  and there is no tongue deviation. Hearing is intact to conversational tone. Sensation: Sensation is intact to light and pinprick throughout (facial, trunk, extremities). Vibration is intact at the bilateral big toe. There is no extinction with double simultaneous stimulation. There is no sensory dermatomal level identified. Motor: Strength is 5/5 in the bilateral upper and lower extremities.   Shoulder shrug is equal and symmetric.  There is no pronator drift. Deep tendon reflexes: Deep tendon reflexes are 2/4 at the bilateral biceps, triceps, brachioradialis, patella and achilles. Plantar responses are downgoing bilaterally.  Movement examination: Tone: There is nl tone in the bilateral upper extremities.  The tone in the lower extremities is nl.  Abnormal movements: none rest tremor.  No intention tremor.  He does have some postural tremor with sustained posture holding on the right hand. Coordination:  There is no decremation with RAM's, with any form of RAMS, including alternating supination and pronation of the forearm, hand opening and closing, finger taps, heel taps and toe taps.  Mild slowness with toe taps on the right Gait and Station: The patient has no difficulty arising out of a deep-seated chair without the use of the hands. The patient's stride length is good with good arm swing.  He jogs up and down my hallway without shoes without dystonic posturing. I have reviewed and interpreted the following labs independently   Chemistry      Component Value Date/Time   NA 138 04/24/2021 1556   K 5.1 04/24/2021 1556   CL 102 04/24/2021 1556   CO2 27 04/24/2021 1556   BUN 9 04/24/2021 1556   CREATININE 0.93 04/24/2021 1556      Component Value Date/Time   CALCIUM 9.8 04/24/2021 1556   AST 16 05/22/2021 1653   ALT 13 05/22/2021 1653   BILITOT 0.4 05/22/2021 1653      Lab Results  Component Value Date   TSH 2.69 08/17/2021   Lab Results  Component Value Date   WBC 5.2 04/24/2021    HGB 15.0 04/24/2021   HCT 44.7 04/24/2021   MCV 90.5 04/24/2021   PLT 375 04/24/2021     Total time spent on today's visit was 60 minutes, including both face-to-face time and nonface-to-face time.  Time included that spent on review of records (prior notes available to me/labs/imaging if pertinent), discussing treatment and goals, answering patient's questions and coordinating care.  Cc:  Berneta Elsie Sayre, MD

## 2024-04-18 ENCOUNTER — Ambulatory Visit: Admitting: Neurology

## 2024-04-18 ENCOUNTER — Encounter: Payer: Self-pay | Admitting: Neurology

## 2024-04-18 VITALS — BP 132/66 | HR 71 | Ht 70.0 in | Wt 174.6 lb

## 2024-04-18 DIAGNOSIS — G241 Genetic torsion dystonia: Secondary | ICD-10-CM

## 2024-04-18 MED ORDER — CARBIDOPA-LEVODOPA 25-100 MG PO TABS: ORAL_TABLET | ORAL | 1 refills | Status: AC

## 2024-04-18 MED ORDER — CARBIDOPA-LEVODOPA 25-100 MG PO TABS: ORAL_TABLET | ORAL | 1 refills | Status: DC

## 2024-04-18 NOTE — Patient Instructions (Signed)
 We will trial carbidopa /levodopa , 1/2 tablet twice per day at about 7am/4pm  carbidopa /levodopa  can be taken at the same time as a carbohydrate, but we like to have you take your pill either 30 minutes before a protein source or 1 hour after as protein can interfere with carbidopa /levodopa  absorption.

## 2024-06-11 ENCOUNTER — Ambulatory Visit: Admitting: Family Medicine

## 2024-06-11 VITALS — BP 116/68 | HR 64 | Temp 98.1°F | Ht 70.0 in | Wt 179.6 lb

## 2024-06-11 DIAGNOSIS — Z13220 Encounter for screening for lipoid disorders: Secondary | ICD-10-CM | POA: Diagnosis not present

## 2024-06-11 DIAGNOSIS — Z23 Encounter for immunization: Secondary | ICD-10-CM | POA: Diagnosis not present

## 2024-06-11 DIAGNOSIS — Z0000 Encounter for general adult medical examination without abnormal findings: Secondary | ICD-10-CM

## 2024-06-11 DIAGNOSIS — Z114 Encounter for screening for human immunodeficiency virus [HIV]: Secondary | ICD-10-CM

## 2024-06-11 LAB — CBC WITH DIFFERENTIAL/PLATELET
Basophils Absolute: 0 K/uL (ref 0.0–0.1)
Basophils Relative: 0.8 % (ref 0.0–3.0)
Eosinophils Absolute: 0.2 K/uL (ref 0.0–0.7)
Eosinophils Relative: 4 % (ref 0.0–5.0)
HCT: 44.6 % (ref 39.0–52.0)
Hemoglobin: 15.4 g/dL (ref 13.0–17.0)
Lymphocytes Relative: 47.5 % — ABNORMAL HIGH (ref 12.0–46.0)
Lymphs Abs: 1.9 K/uL (ref 0.7–4.0)
MCHC: 34.6 g/dL (ref 30.0–36.0)
MCV: 90.4 fl (ref 78.0–100.0)
Monocytes Absolute: 0.4 K/uL (ref 0.1–1.0)
Monocytes Relative: 10.2 % (ref 3.0–12.0)
Neutro Abs: 1.5 K/uL (ref 1.4–7.7)
Neutrophils Relative %: 37.5 % — ABNORMAL LOW (ref 43.0–77.0)
Platelets: 351 K/uL (ref 150.0–400.0)
RBC: 4.93 Mil/uL (ref 4.22–5.81)
RDW: 12.4 % (ref 11.5–15.5)
WBC: 4 K/uL (ref 4.0–10.5)

## 2024-06-11 LAB — LIPID PANEL
Cholesterol: 225 mg/dL — ABNORMAL HIGH (ref 0–200)
HDL: 78.3 mg/dL (ref >39.00)
LDL Cholesterol: 138 mg/dL — ABNORMAL HIGH (ref 0–99)
NonHDL: 146.74
Total CHOL/HDL Ratio: 3
Triglycerides: 45 mg/dL (ref 0.0–149.0)
VLDL: 9 mg/dL (ref 0.0–40.0)

## 2024-06-11 LAB — COMPREHENSIVE METABOLIC PANEL WITH GFR
ALT: 16 U/L (ref 0–53)
AST: 18 U/L (ref 0–37)
Albumin: 4.7 g/dL (ref 3.5–5.2)
Alkaline Phosphatase: 67 U/L (ref 39–117)
BUN: 9 mg/dL (ref 6–23)
CO2: 30 meq/L (ref 19–32)
Calcium: 9.4 mg/dL (ref 8.4–10.5)
Chloride: 100 meq/L (ref 96–112)
Creatinine, Ser: 0.98 mg/dL (ref 0.40–1.50)
GFR: 109.55 mL/min (ref >60.00)
Glucose, Bld: 86 mg/dL (ref 70–99)
Potassium: 4.1 meq/L (ref 3.5–5.1)
Sodium: 138 meq/L (ref 135–145)
Total Bilirubin: 0.7 mg/dL (ref 0.2–1.2)
Total Protein: 7.5 g/dL (ref 6.0–8.3)

## 2024-06-11 LAB — URINALYSIS, ROUTINE W REFLEX MICROSCOPIC
Bilirubin Urine: NEGATIVE
Hgb urine dipstick: NEGATIVE
Ketones, ur: NEGATIVE
Leukocytes,Ua: NEGATIVE
Nitrite: NEGATIVE
RBC / HPF: NONE SEEN (ref 0–?)
Specific Gravity, Urine: 1.015 (ref 1.000–1.030)
Total Protein, Urine: NEGATIVE
Urine Glucose: NEGATIVE
Urobilinogen, UA: 1 (ref 0.0–1.0)
pH: 7.5 (ref 5.0–8.0)

## 2024-06-11 NOTE — Progress Notes (Signed)
 Established Patient Office Visit   Subjective:  Patient ID: Zachary Mcneil, male    DOB: 2000-10-04  Age: 23 y.o. MRN: [id number]  Chief Complaint  Patient presents with   Annual Exam    CPE. Pt is fasting. Wants HIV testing. 2nd HPV today.     HPI Encounter Diagnoses  Name Primary?   Healthcare maintenance Yes   Immunization due    Screening for cholesterol level    Screening for HIV (human immunodeficiency virus)    Here for physical.  Accompanied by his mom.  He is doing well.  Just graduated.  Looking for work in IT.  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  He is [facility] [employer]  He has regular dental care.  Continues to work with neurology regarding possible dystonia.   Review of Systems  Constitutional: Negative.   HENT: Negative.    Eyes:  Negative for blurred vision, discharge and redness.  Respiratory: Negative.    Cardiovascular: Negative.   Gastrointestinal:  Negative for abdominal pain.  Genitourinary: Negative.   Musculoskeletal: Negative.  Negative for myalgias.  Skin:  Negative for rash.  Neurological:  Negative for tingling, loss of consciousness and weakness.  Endo/Heme/Allergies:  Negative for polydipsia.     Current Outpatient Medications:    carbidopa -levodopa  (SINEMET  IR) 25-100 MG tablet, 1/2 tablet twice per day, Disp: 90 tablet, Rfl: 1   Objective:     BP 116/68 (BP Location: Right Arm, Patient Position: Sitting, Cuff Size: Normal)   Pulse 64   Temp 98.1 F (36.7 C) (Oral)   Ht 5' 10 (1.778 m)   Wt 179 lb 9.6 oz (81.5 kg)   SpO2 99%   BMI 25.77 kg/m    Physical Exam Constitutional:      General: He is not in acute distress.    Appearance: Normal appearance. He is not ill-appearing, toxic-appearing or diaphoretic.  HENT:     Head: Normocephalic and atraumatic.     Right Ear: Tympanic membrane, ear canal and external ear normal.     Left Ear: Tympanic membrane, ear canal and external ear  normal.     Mouth/Throat:     Mouth: Mucous membranes are moist.     Pharynx: Oropharynx is clear. No oropharyngeal exudate or posterior oropharyngeal erythema.  Eyes:     General: No scleral icterus.       Right eye: No discharge.        Left eye: No discharge.     Extraocular Movements: Extraocular movements intact.     Conjunctiva/sclera: Conjunctivae normal.     Pupils: Pupils are equal, round, and reactive to light.  Cardiovascular:     Rate and Rhythm: Normal rate and regular rhythm.  Pulmonary:     Effort: Pulmonary effort is normal. No respiratory distress.     Breath sounds: Normal breath sounds.  Abdominal:     General: Bowel sounds are normal.     Tenderness: There is no abdominal tenderness. There is no guarding.     Hernia: No hernia is present.  Genitourinary:    Penis: Normal.      Testes: Normal.  Musculoskeletal:     Cervical back: No rigidity or tenderness.  Skin:    General: Skin is warm and dry.  Neurological:     Mental Status: He is alert and oriented to person, place, and time.  Psychiatric:        Mood and Affect:  Mood normal.        Behavior: Behavior normal.      No results found for any visits on 06/11/24.    The ASCVD Risk score (Arnett DK, et al., 2019) failed to calculate for the following reasons:   The 2019 ASCVD risk score is only valid for ages 20 to 41    Assessment & Plan:   Healthcare maintenance -     CBC with Differential/Platelet -     Urinalysis, Routine w reflex microscopic  Immunization due -     HPV 9-valent vaccine,Recombinat  Screening for cholesterol level -     Comprehensive metabolic panel with GFR -     Lipid panel  Screening for HIV (human immunodeficiency virus) -     HIV Antibody (routine testing w rflx)    Return in about 1 year (around 06/11/2025).  Continue healthy active lifestyle.  Information given on health maintenance and disease prevention.  Elsie Sim Lent, MD

## 2024-06-12 LAB — HIV ANTIBODY (ROUTINE TESTING W REFLEX)
HIV 1&2 Ab, 4th Generation: NONREACTIVE
HIV FINAL INTERPRETATION: NEGATIVE

## 2024-06-14 ENCOUNTER — Ambulatory Visit: Payer: Self-pay | Admitting: Family Medicine

## 2024-06-26 ENCOUNTER — Encounter: Payer: Self-pay | Admitting: Neurology

## 2024-10-29 ENCOUNTER — Ambulatory Visit: Admitting: Neurology
